# Patient Record
Sex: Male | Born: 1970 | Race: White | Hispanic: No | Marital: Single | Smoking: Never smoker
Health system: Southern US, Community
[De-identification: ages and names within clinical notes are randomized; demographics above are authoritative.]

## PROBLEM LIST (undated history)

## (undated) DIAGNOSIS — E119 Type 2 diabetes mellitus without complications: Secondary | ICD-10-CM

## (undated) DIAGNOSIS — I2699 Other pulmonary embolism without acute cor pulmonale: Secondary | ICD-10-CM

## (undated) DIAGNOSIS — I10 Essential (primary) hypertension: Secondary | ICD-10-CM

---

## 2018-07-04 HISTORY — PX: GASTRIC BYPASS: SHX52

## 2018-12-31 ENCOUNTER — Encounter (HOSPITAL_COMMUNITY): Payer: Self-pay | Admitting: Emergency Medicine

## 2018-12-31 ENCOUNTER — Other Ambulatory Visit: Payer: Self-pay

## 2018-12-31 ENCOUNTER — Emergency Department (HOSPITAL_COMMUNITY)
Admission: EM | Admit: 2018-12-31 | Discharge: 2018-12-31 | Disposition: A | Discharge status: Home / Self Care | Payer: Medicaid - Out of State | Attending: Emergency Medicine | Admitting: Emergency Medicine

## 2018-12-31 ENCOUNTER — Emergency Department (HOSPITAL_COMMUNITY): Payer: Medicaid - Out of State

## 2018-12-31 DIAGNOSIS — N289 Disorder of kidney and ureter, unspecified: Secondary | ICD-10-CM

## 2018-12-31 DIAGNOSIS — W19XXXA Unspecified fall, initial encounter: Secondary | ICD-10-CM | POA: Diagnosis not present

## 2018-12-31 DIAGNOSIS — Y9389 Activity, other specified: Secondary | ICD-10-CM | POA: Insufficient documentation

## 2018-12-31 DIAGNOSIS — E119 Type 2 diabetes mellitus without complications: Secondary | ICD-10-CM | POA: Diagnosis not present

## 2018-12-31 DIAGNOSIS — D649 Anemia, unspecified: Secondary | ICD-10-CM | POA: Diagnosis not present

## 2018-12-31 DIAGNOSIS — Y990 Civilian activity done for income or pay: Secondary | ICD-10-CM | POA: Insufficient documentation

## 2018-12-31 DIAGNOSIS — Y9289 Other specified places as the place of occurrence of the external cause: Secondary | ICD-10-CM | POA: Diagnosis not present

## 2018-12-31 DIAGNOSIS — R739 Hyperglycemia, unspecified: Secondary | ICD-10-CM

## 2018-12-31 DIAGNOSIS — R55 Syncope and collapse: Secondary | ICD-10-CM

## 2018-12-31 DIAGNOSIS — R7309 Other abnormal glucose: Secondary | ICD-10-CM

## 2018-12-31 DIAGNOSIS — S0242XA Fracture of alveolus of maxilla, initial encounter for closed fracture: Secondary | ICD-10-CM | POA: Diagnosis not present

## 2018-12-31 DIAGNOSIS — I10 Essential (primary) hypertension: Secondary | ICD-10-CM | POA: Diagnosis not present

## 2018-12-31 HISTORY — DX: Other pulmonary embolism without acute cor pulmonale: I26.99

## 2018-12-31 HISTORY — DX: Essential (primary) hypertension: I10

## 2018-12-31 HISTORY — DX: Type 2 diabetes mellitus without complications: E11.9

## 2018-12-31 LAB — CBC WITH DIFFERENTIAL/PLATELET
Abs Immature Granulocytes: 0.02 10*3/uL (ref 0.00–0.07)
Basophils Absolute: 0 10*3/uL (ref 0.0–0.1)
Basophils Relative: 0 %
Eosinophils Absolute: 0.1 10*3/uL (ref 0.0–0.5)
Eosinophils Relative: 1 %
HCT: 38.1 % — ABNORMAL LOW (ref 39.0–52.0)
Hemoglobin: 12.6 g/dL — ABNORMAL LOW (ref 13.0–17.0)
Immature Granulocytes: 0 %
Lymphocytes Relative: 17 %
Lymphs Abs: 1.8 10*3/uL (ref 0.7–4.0)
MCH: 30 pg (ref 26.0–34.0)
MCHC: 33.1 g/dL (ref 30.0–36.0)
MCV: 90.7 fL (ref 80.0–100.0)
Monocytes Absolute: 0.5 10*3/uL (ref 0.1–1.0)
Monocytes Relative: 5 %
Neutro Abs: 8.1 10*3/uL — ABNORMAL HIGH (ref 1.7–7.7)
Neutrophils Relative %: 77 %
Platelets: 299 10*3/uL (ref 150–400)
RBC: 4.2 MIL/uL — ABNORMAL LOW (ref 4.22–5.81)
RDW: 13 % (ref 11.5–15.5)
WBC: 10.6 10*3/uL — ABNORMAL HIGH (ref 4.0–10.5)
nRBC: 0 % (ref 0.0–0.2)

## 2018-12-31 LAB — CBG MONITORING, ED: Glucose-Capillary: 152 mg/dL — ABNORMAL HIGH (ref 70–99)

## 2018-12-31 LAB — COMPREHENSIVE METABOLIC PANEL
ALT: 18 U/L (ref 0–44)
AST: 20 U/L (ref 15–41)
Albumin: 3.4 g/dL — ABNORMAL LOW (ref 3.5–5.0)
Alkaline Phosphatase: 76 U/L (ref 38–126)
Anion gap: 11 (ref 5–15)
BUN: 30 mg/dL — ABNORMAL HIGH (ref 6–20)
CO2: 25 mmol/L (ref 22–32)
Calcium: 9.4 mg/dL (ref 8.9–10.3)
Chloride: 101 mmol/L (ref 98–111)
Creatinine, Ser: 1.68 mg/dL — ABNORMAL HIGH (ref 0.61–1.24)
GFR calc Af Amer: 55 mL/min — ABNORMAL LOW (ref >60)
GFR calc non Af Amer: 47 mL/min — ABNORMAL LOW (ref >60)
Glucose, Bld: 153 mg/dL — ABNORMAL HIGH (ref 70–99)
Potassium: 3.8 mmol/L (ref 3.5–5.1)
Sodium: 137 mmol/L (ref 135–145)
Total Bilirubin: 0.7 mg/dL (ref 0.3–1.2)
Total Protein: 7.4 g/dL (ref 6.5–8.1)

## 2018-12-31 LAB — ETHANOL: Alcohol, Ethyl (B): <10 mg/dL (ref <10)

## 2018-12-31 LAB — CK: Total CK: 57 U/L (ref 49–397)

## 2018-12-31 LAB — LACTIC ACID, PLASMA: Lactic Acid, Venous: 2.2 mmol/L (ref 0.5–1.9)

## 2018-12-31 MED ORDER — SODIUM CHLORIDE 0.9 % IV BOLUS
1000.0000 mL | Freq: Once | INTRAVENOUS | Status: AC
  Administered 2018-12-31: 1000 mL via INTRAVENOUS

## 2018-12-31 NOTE — Discharge Instructions (Signed)
You might have had a seizure. You CANNOT drive until you are cleared by neurologist   You have a dental fracture. Take tylenol, motrin for pain   Your kidney function is slightly abnormal and your creatinine is 1.68 today. See your doctor for a repeat in a week   Return to ER if you have worse dental pain, passing out, seizure, vomiting.

## 2018-12-31 NOTE — ED Provider Notes (Signed)
[facility] EMERGENCY DEPARTMENT Provider Note   CSN: [id number] Arrival date & time: 12/31/18  0542    History   Chief Complaint Chief Complaint  Patient presents with  . Loss of Consciousness    HPI Derrick Reed is a 48 y.o. male.   The history is provided by the patient.  Loss of Consciousness He has history of hypertension, diabetes, pulmonary embolism, anticoagulated, and comes in following a syncopal episode.  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  He states that 3 upper front teeth were damaged and his chin is very sore.  Loss of consciousness was estimated at 2-59minutes.  There was some report of some shaking, but there was no incontinence and no bit tongue.  When he woke up, he was immediately oriented.  He had syncopal episodes in the past which were related to some medication that he was on.  Once his medications were adjusted, he had no further problems with dizziness or syncope until tonight.  He denies chest pain, heaviness, tightness, pressure.  He denies any palpitations.  He denies alcohol and drug use.  Labs show mild renal insufficiency, mild anemia.  No baseline with which  to compare.  Also, random glucose is mildly elevated.  CK is normal but lactic acid is mildly elevated which does raise possibility of seizure.  Case is signed out to Dr. Silverio Lay.  Past Medical History:  Diagnosis Date  . Diabetes mellitus without complication (HCC)   . Hypertension   . Pulmonary embolism (HCC)     There are no active problems to display for this patient.   Past Surgical History:  Procedure Laterality Date  . 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 No pertinent family history.  Social History Social History   Tobacco Use  . Smoking status: Never Smoker  .  Smokeless tobacco: Never Used  Substance Use Topics  . Alcohol use: Never    Frequency: Never  . Drug use: Never     Allergies   Prednisone   Review of Systems Review of Systems  Cardiovascular: Positive for syncope.  All other systems reviewed and are negative.    Physical Exam Updated Vital Signs BP 112/64   Pulse 64   Resp 10   Ht 5\' 11"  (1.803 m)   Wt 114.8 kg   SpO2 98%   BMI 35.29 kg/m   Physical Exam Vitals signs and nursing note reviewed.    48 year old male, resting comfortably and in no acute distress. Vital signs are normal. Oxygen saturation is 98%, which is normal. Head is normocephalic. PERRLA, EOMI. Oropharynx is clear.  There is tenderness palpation over the jaw.  Teeth 7, 8, 9 are mildly chipped.  There is no malocclusion. Neck is nontender and supple without adenopathy or JVD. Back is nontender and there is no CVA tenderness. Lungs are clear without rales, wheezes, or rhonchi. Chest is nontender. Heart has regular rate and rhythm without murmur. Abdomen is soft, flat, nontender without masses or hepatosplenomegaly and peristalsis is normoactive. Extremities have no cyanosis or edema, full range of motion is present. Skin is warm and dry without rash. Neurologic: Mental status is normal, cranial nerves are intact, there are no motor or sensory deficits.  ED Treatments / Results  Labs (all labs ordered are listed, but only abnormal results are displayed) Labs Reviewed  CBG MONITORING, ED - Abnormal; Notable for the following components:      Result Value   Glucose-Capillary 152 (*)    All other components within normal limits  CBC WITH DIFFERENTIAL/PLATELET  COMPREHENSIVE METABOLIC PANEL  ETHANOL  LACTIC ACID, PLASMA  LACTIC ACID, PLASMA  CK  RAPID URINE DRUG SCREEN, HOSP PERFORMED    EKG EKG Interpretation  Date/Time:  Wednesday December 31 2018 05:53:50 EDT Ventricular Rate:  68 PR Interval:    QRS Duration: 98 QT Interval:  400  QTC Calculation: 426 R Axis:   55 Text Interpretation: Sinus rhythm Normal ECG No old tracing to compare Confirmed by Delora Fuel ([id number]) on 12/31/2018 5:55:59 AM   Radiology No results found.  Procedures Procedures   Medications Ordered in ED Medications - No data to display   Initial Impression / Assessment and Plan / ED Course  I have reviewed the triage vital signs and the nursing notes.  Pertinent labs & imaging results that were available during my care of the patient were reviewed by me and considered in my medical decision making (see chart for details).  Syncope.  Doubt seizure.  CT of head has been requested as well as screening labs including CK and lactic acid.  There are no prior records in the [facility] health system.  Final Clinical Impressions(s) / ED Diagnoses   Final diagnoses:  Syncope and collapse  Closed fracture of alveolar process of maxilla, initial encounter (HCC)  Renal insufficiency  Normochromic normocytic anemia  Elevated random blood glucose level    ED Discharge Orders    None       Delora Fuel, MD 88/28/00 [phone]

## 2018-12-31 NOTE — ED Notes (Signed)
Patient verbalizes understanding of discharge instructions. Opportunity for questioning and answers were provided. Armband removed by staff, pt discharged from ED.  

## 2018-12-31 NOTE — ED Provider Notes (Signed)
  Physical Exam  BP 104/67   Pulse 66   Temp 98.3 F (36.8 C) (Oral)   Resp 13   Ht 5\' 11"  (1.803 m)   Wt 114.8 kg   SpO2 99%   BMI 35.29 kg/m   Physical Exam  ED Course/Procedures     Procedures  MDM  Care assumed at 7 am from Dr. Roxanne Mins.  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  Signout pending labs and lactate and CK level.  Consider syncope versus seizure.  8:32 AM Lactate 2.2. Bicarb normal but Cr 1.68 with no baseline. Given IVF and felt better. Normal neuro exam. He has upper teeth fractures but pain is controlled currently. 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 Told him that he needs to not drive until he is cleared by neurology in case he gets into an accident. He also needs repeat kidney function test and follow up with dentist at [place]. He will not stay here longer so will not refer locally        Drenda Freeze, MD 12/31/18 [phone]

## 2018-12-31 NOTE — ED Triage Notes (Signed)
Pt presents to ED BIB [facility]. Pt c/o syncope while standing in line for work. LOC for 2-3 mins. Pt reports diaphoresis and dizziness just prior to fall. Pt fell face first and has oral trauma to front teeth. Pt c/o jaw pain. 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 EMS VSS.

## 2020-12-17 IMAGING — CT CT HEAD W/O CM
4 series · 16 of 47 positions shown, 18 images · non-contrast
Comparison: None.

CLINICAL DATA: Syncope with fall.  Chin pain.  Initial encounter.

EXAM:
CT HEAD WITHOUT CONTRAST
CT MAXILLOFACIAL WITHOUT CONTRAST
TECHNIQUE: Multidetector CT imaging of the head and maxillofacial structures
were performed using the standard protocol without intravenous
contrast. Multiplanar CT image reconstructions of the maxillofacial
structures were also generated.

[Series 3: head wo · axial · 0.46mm/px · z∈[-124,-4]mm · 7 of 34 slices shown, 9 images]
[im 5/34  brain]
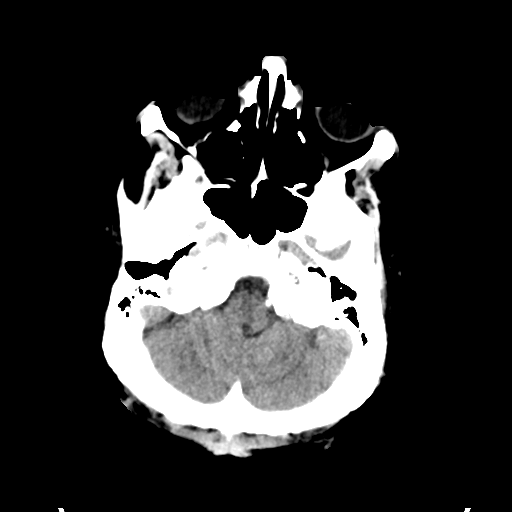
[im 5/34  bone]
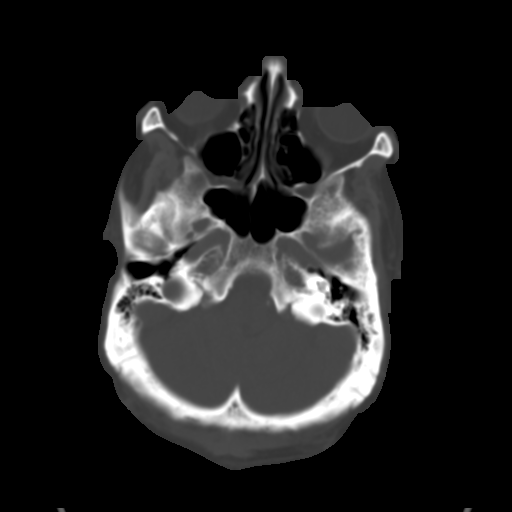
[im 9/34  brain]
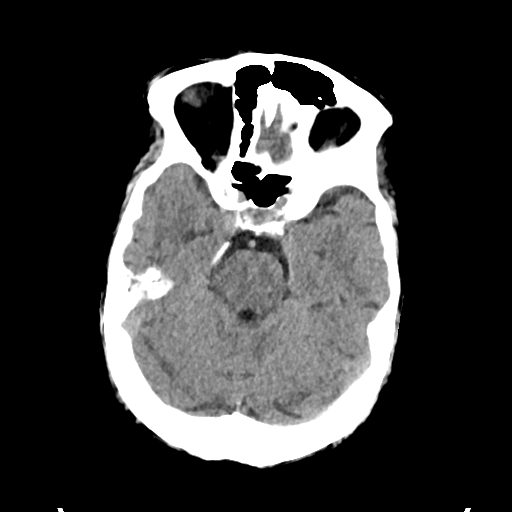
[im 13/34  brain]
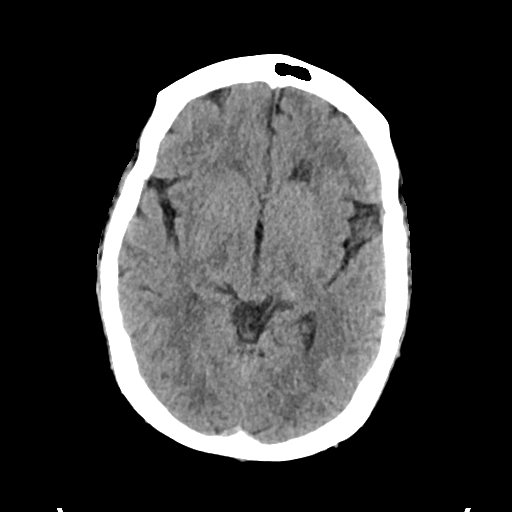
[im 17/34  brain]
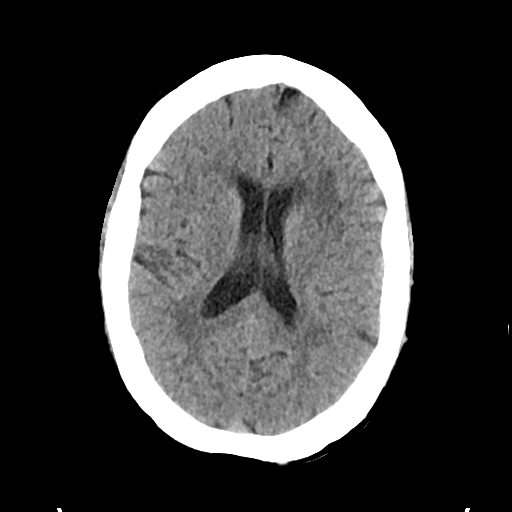
[im 21/34  brain]
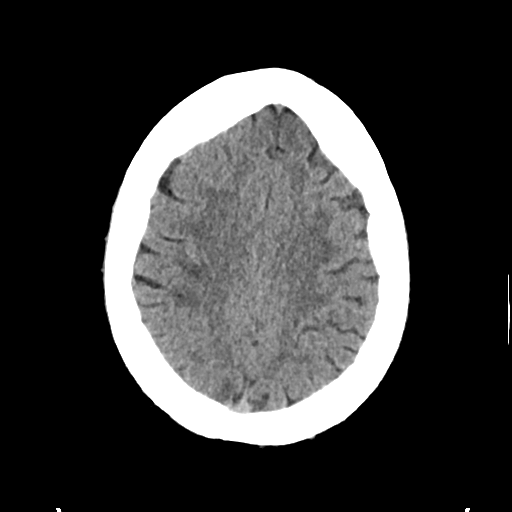
[im 21/34  bone]
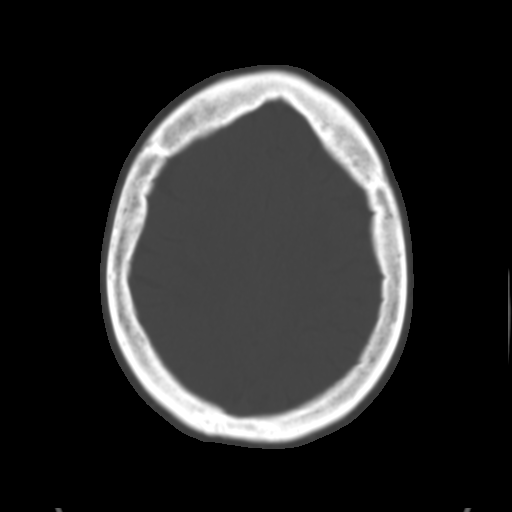
[im 25/34  brain]
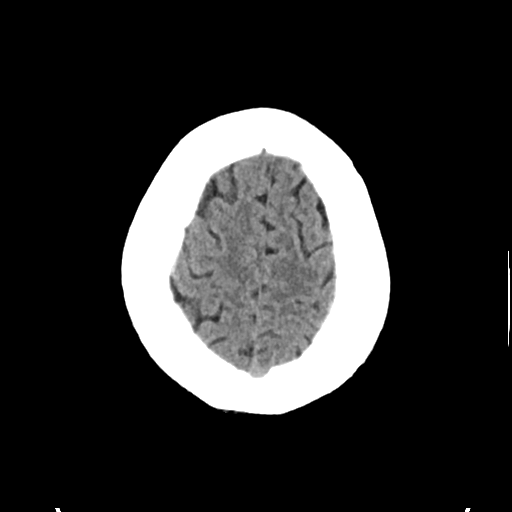
[im 29/34  brain]
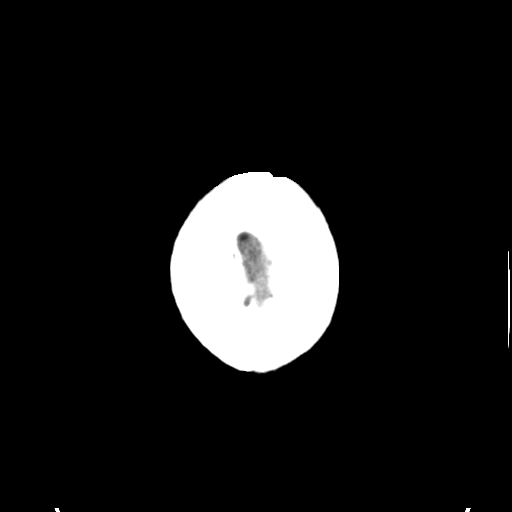

[Series 4: head bone · axial · 0.46mm/px · z∈[-128,-96]mm · 3 of 83 slices shown]
[im 9/83  bone]
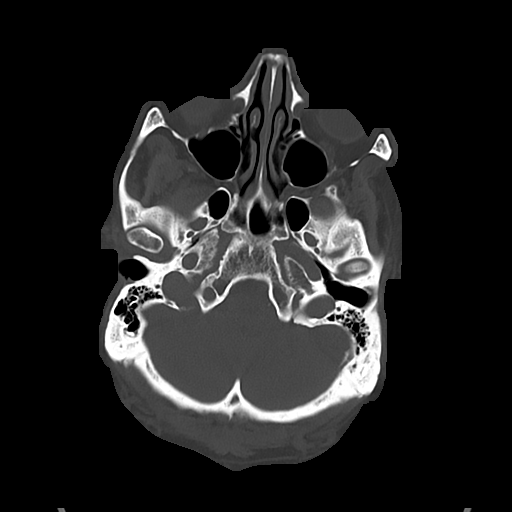
[im 17/83  bone]
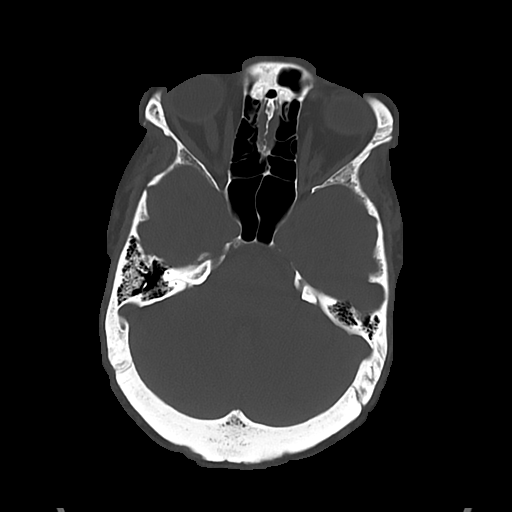
[im 25/83  bone]
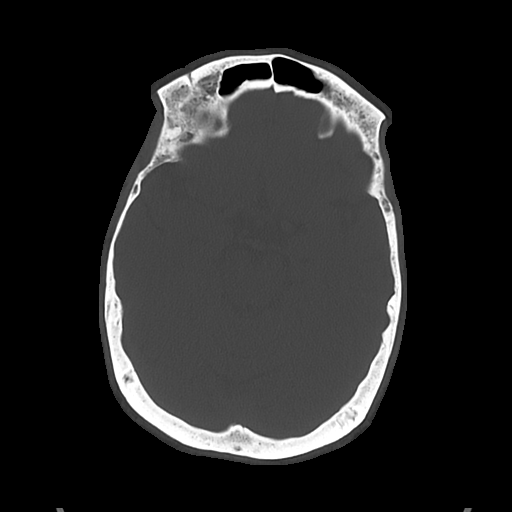

[Series 5: cor soft · coronal · 0.35mm/px · 3 of 76 slices shown]
[im 26/76  brain]
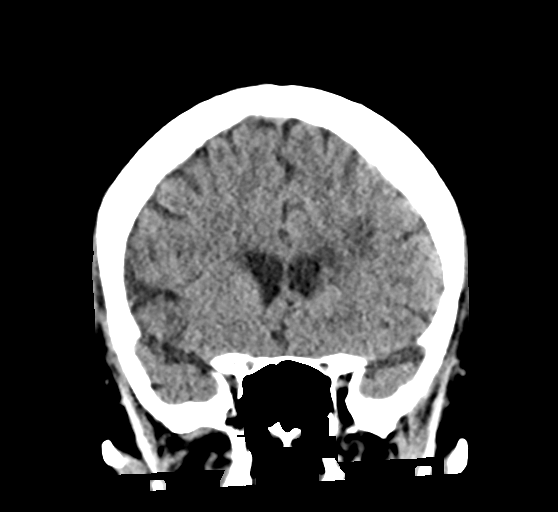
[im 34/76  brain]
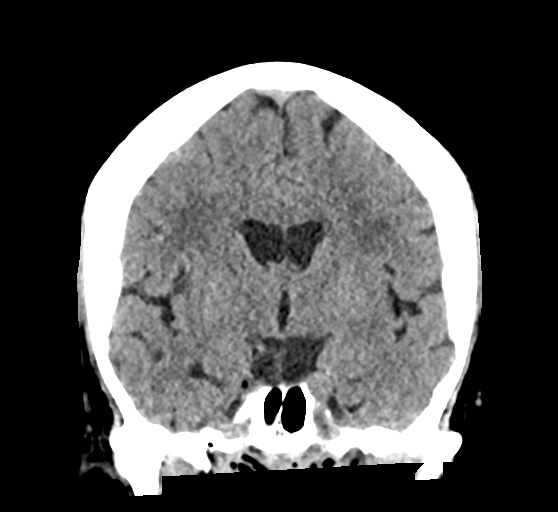
[im 42/76  brain]
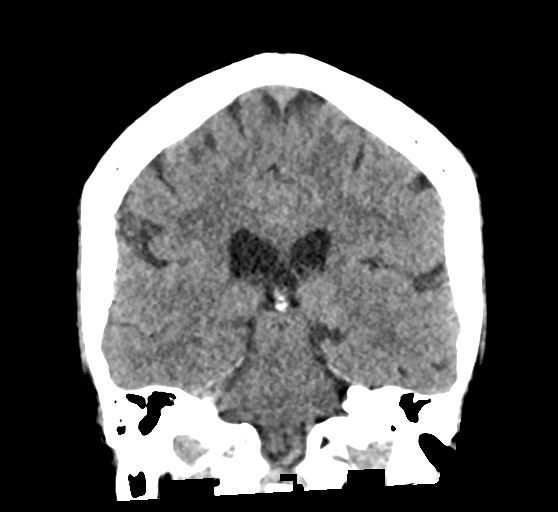

[Series 6: sag soft · sagittal · 0.36mm/px · 3 of 59 slices shown]
[im 20/59  brain]
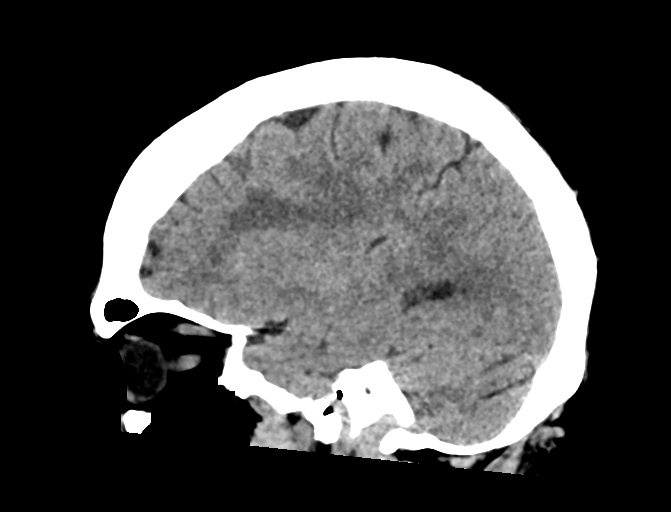
[im 30/59  brain]
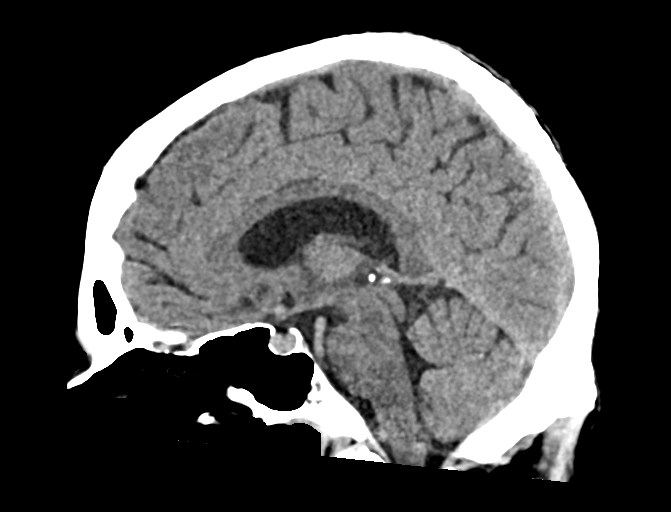
[im 39/59  brain]
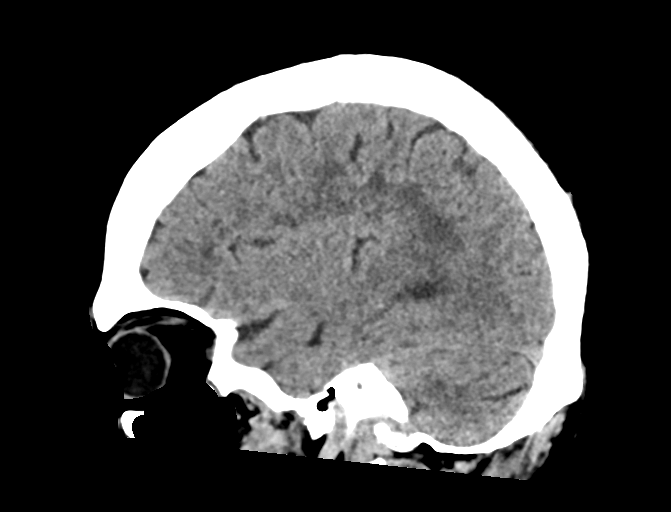

[16 of 47 positions shown; findings below may reference images not displayed]

FINDINGS: CT HEAD FINDINGS

Brain: No evidence of acute infarction, hemorrhage, hydrocephalus,
extra-axial collection or mass lesion/mass effect. Patchy
low-density in the cerebral white matter with chronic lacune at the
left caudate head. Brain volume is normal.

Vascular: Arterial calcification

Skull: Negative for calvarial fracture

Sinuses/Orbits: Negative

CT MAXILLOFACIAL FINDINGS

Osseous: Central maxilla alveolar ridge fracture involving the
labial surface with displaced fragments causing traumatic loosening
of the bilateral central and left lateral incisors. Fracture
continues through the base of the nasal spine. Mandible is intact
and located.

Orbits: Negative.  No evidence of injury.

Sinuses: Negative.  No hemosinus

Soft tissues: Contusion over the chin. No visible opaque foreign
body.
IMPRESSION: Head CT:

1. No evidence of intracranial injury.
2. Premature chronic small vessel ischemia with remote lacunar
infarct.

Face CT:

Central maxilla alveolar ridge fracture involving the labial surface
with traumatic loosening of the bilateral central and left lateral
incisors.
